# Patient Record
Sex: Female | Born: 1970 | Hispanic: No | Smoking: Never smoker
Health system: Southern US, Community
[De-identification: ages and names within clinical notes are randomized; demographics above are authoritative.]

## PROBLEM LIST (undated history)

## (undated) DIAGNOSIS — I10 Essential (primary) hypertension: Secondary | ICD-10-CM

---

## 2005-03-30 ENCOUNTER — Emergency Department: Payer: Self-pay | Admitting: Unknown Physician Specialty

## 2005-06-14 ENCOUNTER — Ambulatory Visit: Payer: Self-pay

## 2005-06-15 ENCOUNTER — Ambulatory Visit: Payer: Self-pay

## 2005-08-23 ENCOUNTER — Ambulatory Visit (HOSPITAL_BASED_OUTPATIENT_CLINIC_OR_DEPARTMENT_OTHER): Admission: RE | Admit: 2005-08-23 | Discharge: 2005-08-24 | Payer: Self-pay | Admitting: Orthopedic Surgery

## 2005-08-23 ENCOUNTER — Ambulatory Visit (HOSPITAL_COMMUNITY): Admission: RE | Admit: 2005-08-23 | Discharge: 2005-08-23 | Payer: Self-pay | Admitting: Orthopedic Surgery

## 2007-04-13 IMAGING — RF DG ARTHROGRAM INJ W/FG MRI/CT SHOULDER*L*
1 series · 1 of 1 positions shown · non-contrast
Comparison: none

REASON FOR EXAM: LEFT shoulder trauma, evaluate labral tear
COMMENTS:

[Series 1: run · 1 of 1 slices shown]
[im 1/1]
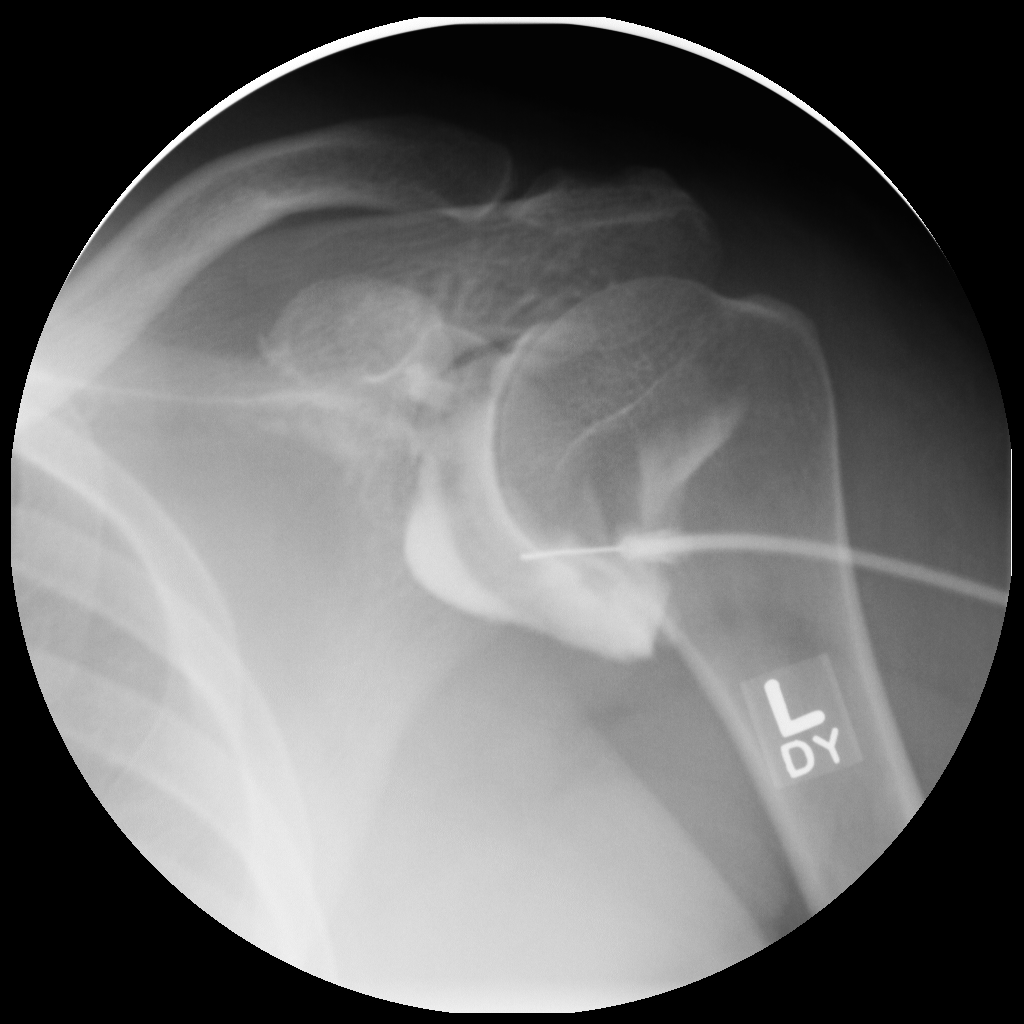

[1 of 1 positions shown; findings below may reference images not displayed]

PROCEDURE:     FL  - FL INJ LT SHOULDER MR PRABHU DAYAL  - June 14, 2005 [DATE]

RESULT:     The patient was prepped and draped in the usual fashion.  After
administration of local anesthetic, a 22-gauge spinal needle was placed into
the shoulder joint and a mixture of 15 cc of saline, [id number] 10 cc and
Magnevist 0.1 cc was injected into the shoulder.  A total of 14 cc of the
mixture was injected.  The patient was then sent to MR for further
evaluation.
IMPRESSION: Please see above.

## 2019-08-27 ENCOUNTER — Other Ambulatory Visit: Payer: Self-pay

## 2019-08-27 DIAGNOSIS — Z20822 Contact with and (suspected) exposure to covid-19: Secondary | ICD-10-CM

## 2019-08-29 LAB — NOVEL CORONAVIRUS, NAA: SARS-CoV-2, NAA: NOT DETECTED

## 2019-11-24 ENCOUNTER — Ambulatory Visit: Payer: Self-pay

## 2022-10-01 ENCOUNTER — Emergency Department
Admission: EM | Admit: 2022-10-01 | Discharge: 2022-10-01 | Disposition: A | Discharge status: Home / Self Care | Payer: Self-pay | Attending: Emergency Medicine | Admitting: Emergency Medicine

## 2022-10-01 ENCOUNTER — Emergency Department: Payer: Self-pay

## 2022-10-01 ENCOUNTER — Other Ambulatory Visit: Payer: Self-pay

## 2022-10-01 ENCOUNTER — Encounter: Payer: Self-pay | Admitting: Emergency Medicine

## 2022-10-01 DIAGNOSIS — G43801 Other migraine, not intractable, with status migrainosus: Secondary | ICD-10-CM | POA: Insufficient documentation

## 2022-10-01 DIAGNOSIS — R55 Syncope and collapse: Secondary | ICD-10-CM | POA: Insufficient documentation

## 2022-10-01 DIAGNOSIS — I10 Essential (primary) hypertension: Secondary | ICD-10-CM | POA: Insufficient documentation

## 2022-10-01 HISTORY — DX: Essential (primary) hypertension: I10

## 2022-10-01 LAB — CBC
HCT: 42.5 % (ref 36.0–46.0)
Hemoglobin: 13.6 g/dL (ref 12.0–15.0)
MCH: 29.1 pg (ref 26.0–34.0)
MCHC: 32 g/dL (ref 30.0–36.0)
MCV: 90.8 fL (ref 80.0–100.0)
Platelets: 210 10*3/uL (ref 150–400)
RBC: 4.68 MIL/uL (ref 3.87–5.11)
RDW: 12.6 % (ref 11.5–15.5)
WBC: 7.2 10*3/uL (ref 4.0–10.5)
nRBC: 0 % (ref 0.0–0.2)

## 2022-10-01 LAB — BASIC METABOLIC PANEL
Anion gap: 7 (ref 5–15)
BUN: 14 mg/dL (ref 6–20)
CO2: 25 mmol/L (ref 22–32)
Calcium: 8.7 mg/dL — ABNORMAL LOW (ref 8.9–10.3)
Chloride: 105 mmol/L (ref 98–111)
Creatinine, Ser: 0.95 mg/dL (ref 0.44–1.00)
GFR, Estimated: >60 mL/min (ref >60)
Glucose, Bld: 132 mg/dL — ABNORMAL HIGH (ref 70–99)
Potassium: 3.8 mmol/L (ref 3.5–5.1)
Sodium: 137 mmol/L (ref 135–145)

## 2022-10-01 LAB — URINALYSIS, ROUTINE W REFLEX MICROSCOPIC
Bilirubin Urine: NEGATIVE
Glucose, UA: NEGATIVE mg/dL
Hgb urine dipstick: NEGATIVE
Ketones, ur: NEGATIVE mg/dL
Leukocytes,Ua: NEGATIVE
Nitrite: NEGATIVE
Protein, ur: NEGATIVE mg/dL
Specific Gravity, Urine: 1.015 (ref 1.005–1.030)
pH: 6 (ref 5.0–8.0)

## 2022-10-01 MED ORDER — KETOROLAC TROMETHAMINE 10 MG PO TABS
10.0000 mg | ORAL_TABLET | Freq: Once | ORAL | Status: AC
  Administered 2022-10-01: 10 mg via ORAL
  Filled 2022-10-01: qty 1

## 2022-10-01 MED ORDER — NAPROXEN 500 MG PO TABS: 500.0000 mg | ORAL_TABLET | Freq: Two times a day (BID) | ORAL | 0 refills | Status: AC | PRN

## 2022-10-01 MED ORDER — METOCLOPRAMIDE HCL 10 MG PO TABS
10.0000 mg | ORAL_TABLET | Freq: Once | ORAL | Status: AC
  Administered 2022-10-01: 10 mg via ORAL
  Filled 2022-10-01: qty 1

## 2022-10-01 MED ORDER — DIPHENHYDRAMINE HCL 25 MG PO CAPS: 25.0000 mg | ORAL_CAPSULE | Freq: Four times a day (QID) | ORAL | 0 refills | Status: AC | PRN

## 2022-10-01 MED ORDER — METOCLOPRAMIDE HCL 10 MG PO TABS: 10.0000 mg | ORAL_TABLET | Freq: Four times a day (QID) | ORAL | 0 refills | Status: AC | PRN

## 2022-10-01 MED ORDER — DIPHENHYDRAMINE HCL 25 MG PO CAPS
50.0000 mg | ORAL_CAPSULE | Freq: Once | ORAL | Status: AC
  Administered 2022-10-01: 50 mg via ORAL
  Filled 2022-10-01: qty 2

## 2022-10-01 NOTE — ED Notes (Signed)
Pt does not need drug alcohol testing form [place]. 

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
## 2022-10-01 NOTE — ED Triage Notes (Addendum)
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 Pt has a headache  Vitals with EMS 184/98 101 HR  Pt will need drug/alcohol screen per employer.
